# Patient Record
Sex: Male | Born: 1985 | Race: Black or African American | Hispanic: No | Marital: Single | Smoking: Current every day smoker
Health system: Southern US, Community
[De-identification: ages and names within clinical notes are randomized; demographics above are authoritative.]

## PROBLEM LIST (undated history)

## (undated) ENCOUNTER — Emergency Department: Admission: EM | Payer: Self-pay | Source: Home / Self Care

## (undated) DIAGNOSIS — I10 Essential (primary) hypertension: Secondary | ICD-10-CM

---

## 2004-05-01 ENCOUNTER — Emergency Department: Payer: Self-pay | Admitting: Emergency Medicine

## 2006-01-17 ENCOUNTER — Emergency Department: Payer: Self-pay | Admitting: General Practice

## 2017-09-23 ENCOUNTER — Other Ambulatory Visit: Payer: Self-pay

## 2017-09-23 ENCOUNTER — Encounter: Payer: Self-pay | Admitting: Emergency Medicine

## 2017-09-23 ENCOUNTER — Emergency Department
Admission: EM | Admit: 2017-09-23 | Discharge: 2017-09-23 | Disposition: A | Discharge status: Home / Self Care | Payer: Self-pay | Attending: Emergency Medicine | Admitting: Emergency Medicine

## 2017-09-23 DIAGNOSIS — F17210 Nicotine dependence, cigarettes, uncomplicated: Secondary | ICD-10-CM | POA: Insufficient documentation

## 2017-09-23 DIAGNOSIS — J020 Streptococcal pharyngitis: Secondary | ICD-10-CM | POA: Insufficient documentation

## 2017-09-23 DIAGNOSIS — I10 Essential (primary) hypertension: Secondary | ICD-10-CM | POA: Insufficient documentation

## 2017-09-23 HISTORY — DX: Essential (primary) hypertension: I10

## 2017-09-23 LAB — GROUP A STREP BY PCR: GROUP A STREP BY PCR: DETECTED — AB

## 2017-09-23 MED ORDER — PREDNISONE 20 MG PO TABS
60.0000 mg | ORAL_TABLET | Freq: Once | ORAL | Status: AC
  Administered 2017-09-23: 60 mg via ORAL
  Filled 2017-09-23: qty 3

## 2017-09-23 MED ORDER — AMOXICILLIN-POT CLAVULANATE 875-125 MG PO TABS: 1.0000 | ORAL_TABLET | Freq: Two times a day (BID) | ORAL | 0 refills | Status: AC

## 2017-09-23 MED ORDER — AMOXICILLIN-POT CLAVULANATE 875-125 MG PO TABS
1.0000 | ORAL_TABLET | Freq: Once | ORAL | Status: AC
  Administered 2017-09-23: 1 via ORAL
  Filled 2017-09-23: qty 1

## 2017-09-23 MED ORDER — PREDNISONE 10 MG PO TABS: 10.0000 mg | ORAL_TABLET | Freq: Every day | ORAL | 0 refills | Status: AC

## 2017-09-23 NOTE — ED Provider Notes (Signed)
[facility] EMERGENCY DEPARTMENT Provider Note   CSN: [id number] Arrival date & time: 09/23/17  2043     History   Chief Complaint Chief Complaint  Patient presents with  . Sore Throat    HPI Jon Benjamin is a 32 y.o. male.  Presents to the emergency department evaluation of sore throat x7 days.  He denies any fevers but has had chills.  No cough congestion or runny nose.  No rashes or abdominal pain.  No chest pain or shortness of breath.  His girlfriend has had similar symptoms, his symptoms began first.  He is tolerating p.o. well.  Pain is 8 out of 10 with swallowing.  HPI  Past Medical History:  Diagnosis Date  . Hypertension     There are no active problems to display for this patient.   History reviewed. No pertinent surgical history.      Home Medications    Prior to Admission medications   Medication Sig Start Date End Date Taking? Authorizing Provider  amoxicillin-clavulanate (AUGMENTIN) 875-125 MG tablet Take 1 tablet by mouth every 12 (twelve) hours for 10 days. 09/23/17 10/03/17  Evon SlackGaines, Thomas C, PA-C  predniSONE (DELTASONE) 10 MG tablet Take 1 tablet (10 mg total) by mouth daily. 6,5,4,3,2,1 six day taper 09/23/17   Evon SlackGaines, Thomas C, PA-C    Family History No family history on file.  Social History Social History   Tobacco Use  . Smoking status: Current Every Day Smoker  . Smokeless tobacco: Never Used  Substance Use Topics  . Alcohol use: Not on file  . Drug use: Not on file     Allergies   Patient has no known allergies.   Review of Systems Review of Systems  Constitutional: Positive for chills. Negative for fever.  HENT: Positive for sore throat. Negative for congestion, rhinorrhea, sinus pressure and sinus pain.   Respiratory: Negative for cough, wheezing and stridor.   Gastrointestinal: Negative for diarrhea, nausea and vomiting.  Musculoskeletal: Positive for myalgias. Negative for arthralgias and neck  stiffness.  Skin: Negative for rash and wound.  Neurological: Negative for dizziness.     Physical Exam Updated Vital Signs BP (!) 176/80 (BP Location: Left Arm)   Pulse 86   Temp 99 F (37.2 C) (Oral)   Resp 20   Ht 6\' 1"  (1.854 m)   Wt 99.8 kg   SpO2 100%   BMI 29.03 kg/m   Physical Exam  Constitutional: He is oriented to person, place, and time. He appears well-developed and well-nourished. No distress.  HENT:  Head: Normocephalic and atraumatic.  Right Ear: Hearing, tympanic membrane, external ear and ear canal normal.  Left Ear: Hearing, tympanic membrane, external ear and ear canal normal.  Nose: Rhinorrhea present. No nasal septal hematoma. Right sinus exhibits no maxillary sinus tenderness and no frontal sinus tenderness. Left sinus exhibits no maxillary sinus tenderness and no frontal sinus tenderness.  Mouth/Throat: No trismus in the jaw. No uvula swelling. Oropharyngeal exudate present. No posterior oropharyngeal edema, posterior oropharyngeal erythema or tonsillar abscesses.  Uvula is midline.  Tolerating p.o. well.  Eyes: Conjunctivae are normal.  Neck: Normal range of motion.  Cardiovascular: Normal rate and regular rhythm.  Pulmonary/Chest: No stridor. No respiratory distress. He has no wheezes. He exhibits no tenderness.  Abdominal: Soft. He exhibits no distension. There is no tenderness. There is no guarding.  Musculoskeletal: Normal range of motion.  Lymphadenopathy:    He has no cervical adenopathy.  Neurological: He is  alert and oriented to person, place, and time.  Skin: Skin is warm and dry. No rash noted.  Psychiatric: He has a normal mood and affect. His behavior is normal. Judgment and thought content normal.     ED Treatments / Results  Labs (all labs ordered are listed, but only abnormal results are displayed) Labs Reviewed  GROUP A STREP BY PCR - Abnormal; Notable for the following components:      Result Value   Group A Strep by PCR  DETECTED (*)    All other components within normal limits    EKG None  Radiology No results found.  Procedures Procedures (including critical care time)  Medications Ordered in ED Medications  amoxicillin-clavulanate (AUGMENTIN) 875-125 MG per tablet 1 tablet (1 tablet Oral Given 09/23/17 2213)  predniSONE (DELTASONE) tablet 60 mg (60 mg Oral Given 09/23/17 2213)     Initial Impression / Assessment and Plan / ED Course  I have reviewed the triage vital signs and the nursing notes.  Pertinent labs & imaging results that were available during my care of the patient were reviewed by me and considered in my medical decision making (see chart for details).     32 year old male with strep pharyngitis.  PCR positive for strep.  Vital signs are stable, low-grade fever of 99.  Patient tolerating p.o. well.  He is given a prescription for Augmentin and prednisone.  Final Clinical Impressions(s) / ED Diagnoses   Final diagnoses:  Strep pharyngitis    ED Discharge Orders         Ordered    predniSONE (DELTASONE) 10 MG tablet  Daily     09/23/17 2215    amoxicillin-clavulanate (AUGMENTIN) 875-125 MG tablet  Every 12 hours     09/23/17 2215           Ronnette Juniper 09/23/17 2218    Jeanmarie Plant, MD 09/24/17 [phone]

## 2017-09-23 NOTE — ED Triage Notes (Signed)
Patient ambulatory to triage with steady gait, without difficulty or distress noted; pt reports sore throat since last wk; here with SO with same symptoms; denies any accomp symptoms

## 2017-09-23 NOTE — Discharge Instructions (Signed)
Please make sure drinking lots of fluids.  Take antibiotics as prescribed.

## 2019-05-12 ENCOUNTER — Encounter: Payer: Self-pay | Admitting: Emergency Medicine

## 2019-05-12 ENCOUNTER — Other Ambulatory Visit: Payer: Self-pay

## 2019-05-12 ENCOUNTER — Emergency Department
Admission: EM | Admit: 2019-05-12 | Discharge: 2019-05-12 | Disposition: A | Discharge status: Home / Self Care | Payer: Self-pay | Attending: Emergency Medicine | Admitting: Emergency Medicine

## 2019-05-12 ENCOUNTER — Emergency Department: Payer: Self-pay

## 2019-05-12 DIAGNOSIS — F17200 Nicotine dependence, unspecified, uncomplicated: Secondary | ICD-10-CM | POA: Insufficient documentation

## 2019-05-12 DIAGNOSIS — I10 Essential (primary) hypertension: Secondary | ICD-10-CM | POA: Insufficient documentation

## 2019-05-12 LAB — BASIC METABOLIC PANEL
Anion gap: 6 (ref 5–15)
BUN: 13 mg/dL (ref 6–20)
CO2: 30 mmol/L (ref 22–32)
Calcium: 9.3 mg/dL (ref 8.9–10.3)
Chloride: 103 mmol/L (ref 98–111)
Creatinine, Ser: 1.2 mg/dL (ref 0.61–1.24)
GFR calc Af Amer: >60 mL/min (ref >60)
GFR calc non Af Amer: >60 mL/min (ref >60)
Glucose, Bld: 101 mg/dL — ABNORMAL HIGH (ref 70–99)
Potassium: 3.7 mmol/L (ref 3.5–5.1)
Sodium: 139 mmol/L (ref 135–145)

## 2019-05-12 LAB — CBC
HCT: 46.9 % (ref 39.0–52.0)
Hemoglobin: 15.3 g/dL (ref 13.0–17.0)
MCH: 28.3 pg (ref 26.0–34.0)
MCHC: 32.6 g/dL (ref 30.0–36.0)
MCV: 86.9 fL (ref 80.0–100.0)
Platelets: 285 10*3/uL (ref 150–400)
RBC: 5.4 MIL/uL (ref 4.22–5.81)
RDW: 13.3 % (ref 11.5–15.5)
WBC: 5 10*3/uL (ref 4.0–10.5)
nRBC: 0 % (ref 0.0–0.2)

## 2019-05-12 LAB — TROPONIN I (HIGH SENSITIVITY): Troponin I (High Sensitivity): 9 ng/L (ref <18)

## 2019-05-12 MED ORDER — BENAZEPRIL HCL 10 MG PO TABS
10.0000 mg | ORAL_TABLET | Freq: Every day | ORAL | Status: DC
  Administered 2019-05-12: 10 mg via ORAL
  Filled 2019-05-12 (×2): qty 1

## 2019-05-12 MED ORDER — AMLODIPINE BESYLATE 5 MG PO TABS
5.0000 mg | ORAL_TABLET | Freq: Once | ORAL | Status: AC
  Administered 2019-05-12: 15:00:00 5 mg via ORAL
  Filled 2019-05-12: qty 1

## 2019-05-12 MED ORDER — BENAZEPRIL HCL 10 MG PO TABS: 10.0000 mg | ORAL_TABLET | Freq: Every day | ORAL | 1 refills | Status: AC

## 2019-05-12 MED ORDER — AMLODIPINE BESYLATE 5 MG PO TABS: 5.0000 mg | ORAL_TABLET | Freq: Every day | ORAL | 1 refills | Status: AC

## 2019-05-12 MED ORDER — SODIUM CHLORIDE 0.9% FLUSH: 3.0000 mL | Freq: Once | INTRAVENOUS | Status: DC

## 2019-05-12 NOTE — ED Provider Notes (Signed)
[facility] Emergency Department Provider Note   ____________________________________________    I have reviewed the triage vital signs and the nursing notes.   HISTORY  Chief Complaint Hypertension     HPI Jon Benjamin is a 34 y.o. male who reports a history of high blood pressure and has been prescribed blood pressure medication in the past but has not taken it.  Yesterday he checked his blood pressure at his father's house and found it to be high.  He does not have a PCP.  He feels well he has no symptoms.  Decided to come today because his family pressure to him.  Reports he works out daily.  Past Medical History:  Diagnosis Date  . Hypertension     There are no problems to display for this patient.   History reviewed. No pertinent surgical history.  Prior to Admission medications   Medication Sig Start Date End Date Taking? Authorizing Provider  amLODipine (NORVASC) 5 MG tablet Take 1 tablet (5 mg total) by mouth daily. 05/12/19 05/11/20  Lavonia Drafts, MD  benazepril (LOTENSIN) 10 MG tablet Take 1 tablet (10 mg total) by mouth daily. 05/12/19 07/11/19  Lavonia Drafts, MD  predniSONE (DELTASONE) 10 MG tablet Take 1 tablet (10 mg total) by mouth daily. 6,5,4,3,2,1 six day taper 09/23/17   Duanne Guess, PA-C     Allergies Patient has no known allergies.  No family history on file.  Social History Social History   Tobacco Use  . Smoking status: Current Every Day Smoker  . Smokeless tobacco: Never Used  Substance Use Topics  . Alcohol use: Not on file  . Drug use: Not on file    Review of Systems  Constitutional: No fever/chills Eyes: No visual changes.  ENT: No sore throat. Cardiovascular: Denies chest pain. Respiratory: Denies shortness of breath. Gastrointestinal: No abdominal pain.   Genitourinary: Negative for dysuria. Musculoskeletal: Negative for back pain. Skin: Negative for rash. Neurological: Negative for  headaches or weakness   ____________________________________________   PHYSICAL EXAM:  VITAL SIGNS: ED Triage Vitals  Enc Vitals Group     BP 05/12/19 1142 (!) 216/138     Pulse Rate 05/12/19 1142 65     Resp 05/12/19 1142 20     Temp 05/12/19 1142 98.6 F (37 C)     Temp Source 05/12/19 1142 Oral     SpO2 05/12/19 1142 100 %     Weight 05/12/19 1144 99.8 kg (220 lb)     Height 05/12/19 1144 1.854 m (6\' 1" )     Head Circumference --      Peak Flow --      Pain Score 05/12/19 1147 0     Pain Loc --      Pain Edu? --      Excl. in [place]? --     Constitutional: Alert and oriented. No acute distress. Pleasant and interactive Eyes: Conjunctivae are normal.   Mouth/Throat: Mucous membranes are moist.   Neck:  Painless ROM Cardiovascular: Normal rate, regular rhythm. Grossly normal heart sounds.  Good peripheral circulation. Respiratory: Normal respiratory effort.  No retractions. Lungs CTAB. Gastrointestinal: Soft and nontender. No distention.  No CVA tenderness.  Musculoskeletal:   Warm and well perfused Neurologic:  Normal speech and language. No gross focal neurologic deficits are appreciated.  Skin:  Skin is warm, dry and intact. No rash noted. Psychiatric: Mood and affect are normal. Speech and behavior are normal.  ____________________________________________   LABS (  all labs ordered are listed, but only abnormal results are displayed)  Labs Reviewed  BASIC METABOLIC PANEL - Abnormal; Notable for the following components:      Result Value   Glucose, Bld 101 (*)    All other components within normal limits  CBC  TROPONIN I (HIGH SENSITIVITY)   ____________________________________________  EKG  ED ECG REPORT I, Jene Every, the attending physician, personally viewed and interpreted this ECG.  Date: 05/12/2019  Rhythm: normal sinus rhythm QRS Axis: normal Intervals: normal ST/T Wave abnormalities: normal Narrative Interpretation:  LVH  ____________________________________________  RADIOLOGY  None ____________________________________________   PROCEDURES  Procedure(s) performed: No  Procedures   Critical Care performed: No ____________________________________________   INITIAL IMPRESSION / ASSESSMENT AND PLAN / ED COURSE  Pertinent labs & imaging results that were available during my care of the patient were reviewed by me and considered in my medical decision making (see chart for details).  Patient well-appearing and in no acute distress, does have significant high blood pressure however this is apparently chronic.  He is totally asymptomatic.  Lab work including kidney function is reassuring.  We will start the patient on amlodipine and benazepril, first dose given here in the emergency department.  Emphasized need for close follow-up with PCP for titration.    ____________________________________________   FINAL CLINICAL IMPRESSION(S) / ED DIAGNOSES  Final diagnoses:  Essential hypertension        Note:  This document was prepared using Dragon voice recognition software and may include unintentional dictation errors.   Jene Every, MD 05/12/19 1744

## 2019-05-12 NOTE — ED Notes (Signed)
Pt verbalized understanding of discharge instructions. NAD at this time. 

## 2019-05-12 NOTE — ED Triage Notes (Signed)
Arrives with c/o hypertension.  States was prescribed medication for BP over a year ago, but has not taken medication since Rx ran out.

## 2020-06-23 ENCOUNTER — Emergency Department
Admission: EM | Admit: 2020-06-23 | Discharge: 2020-06-23 | Disposition: A | Discharge status: Home / Self Care | Payer: Self-pay | Attending: Emergency Medicine | Admitting: Emergency Medicine

## 2020-06-23 ENCOUNTER — Other Ambulatory Visit: Payer: Self-pay

## 2020-06-23 ENCOUNTER — Emergency Department: Payer: Self-pay

## 2020-06-23 DIAGNOSIS — N5089 Other specified disorders of the male genital organs: Secondary | ICD-10-CM

## 2020-06-23 DIAGNOSIS — F17200 Nicotine dependence, unspecified, uncomplicated: Secondary | ICD-10-CM | POA: Insufficient documentation

## 2020-06-23 DIAGNOSIS — N451 Epididymitis: Secondary | ICD-10-CM | POA: Insufficient documentation

## 2020-06-23 DIAGNOSIS — I10 Essential (primary) hypertension: Secondary | ICD-10-CM | POA: Insufficient documentation

## 2020-06-23 DIAGNOSIS — Z79899 Other long term (current) drug therapy: Secondary | ICD-10-CM | POA: Insufficient documentation

## 2020-06-23 LAB — URINALYSIS, ROUTINE W REFLEX MICROSCOPIC
Bilirubin Urine: NEGATIVE
Glucose, UA: NEGATIVE mg/dL
Hgb urine dipstick: NEGATIVE
Ketones, ur: NEGATIVE mg/dL
Nitrite: NEGATIVE
Protein, ur: NEGATIVE mg/dL
Specific Gravity, Urine: 1.012 (ref 1.005–1.030)
Squamous Epithelial / HPF: NONE SEEN (ref 0–5)
WBC, UA: >50 WBC/hpf — ABNORMAL HIGH (ref 0–5)
pH: 7 (ref 5.0–8.0)

## 2020-06-23 MED ORDER — DOXYCYCLINE HYCLATE 100 MG PO CAPS: 100.0000 mg | ORAL_CAPSULE | Freq: Two times a day (BID) | ORAL | 0 refills | Status: AC

## 2020-06-23 MED ORDER — CEFTRIAXONE SODIUM 1 G IJ SOLR
500.0000 mg | Freq: Once | INTRAMUSCULAR | Status: AC
  Administered 2020-06-23: 500 mg via INTRAMUSCULAR
  Filled 2020-06-23: qty 10

## 2020-06-23 MED ORDER — DOXYCYCLINE HYCLATE 100 MG PO TABS
100.0000 mg | ORAL_TABLET | Freq: Once | ORAL | Status: AC
  Administered 2020-06-23: 100 mg via ORAL
  Filled 2020-06-23: qty 1

## 2020-06-23 NOTE — ED Notes (Signed)
Patient returned from Ultrasound. 

## 2020-06-23 NOTE — ED Triage Notes (Signed)
Pt to ER via POV with complaints of left sided testicle swelling. States over the last four days it has intermittently swelled.   Pt reports no urinary symptoms. No drainage, redness, or pain to testes. No known fevers.   Pt reports swelling occurs after exercising and weight lifting. Reports using the jacuzzi and sauna.

## 2020-06-23 NOTE — ED Provider Notes (Signed)
[facility] Emergency Department Provider Note   ____________________________________________   Event Date/Time   First MD Initiated Contact with Patient 06/23/20 1445     (approximate)  I have reviewed the triage vital signs and the nursing notes.   HISTORY  Chief Complaint Groin Swelling    HPI CHANC Jon Benjamin is a 35 y.o. male for evaluation of discomfort in his left testicle for about a week   Patient reports that he noticed when he got up off the couch about 4 to 5 days ago that his left testicle seemed a little bit sore and slightly swollen.  He reports he has not had any discharge no pain or burning with urination.  Not severely painful but does feel little bit swollen and a little bit tender especially towards the back  He is sexually active but with the same partner 1 male for a long time.  He does not engage in multiple partners.  In addition, he does report he has been in and out of a hot tub recently [place] and sauna at the gym.  Thinking maybe he had picked up a potential infection there  No fevers or chills no abdominal pain.  No chest pain no trouble breathing  No previous history of any STDs or urinary tract infection  Past Medical History:  Diagnosis Date  . Hypertension     There are no problems to display for this patient.   History reviewed. No pertinent surgical history.  Prior to Admission medications   Medication Sig Start Date End Date Taking? Authorizing Provider         amLODipine (NORVASC) 5 MG tablet Take 1 tablet (5 mg total) by mouth daily. 05/12/19 05/11/20  Jene Every, MD  benazepril (LOTENSIN) 10 MG tablet Take 1 tablet (10 mg total) by mouth daily. 05/12/19 07/11/19  Jene Every, MD  predniSONE (DELTASONE) 10 MG tablet Take 1 tablet (10 mg total) by mouth daily. 6,5,4,3,2,1 six day taper 09/23/17   Evon Slack, PA-C    Allergies Patient has no known allergies.  No family history on  file.  Social History Social History   Tobacco Use  . Smoking status: Current Every Day Smoker  . Smokeless tobacco: Never Used    Review of Systems Constitutional: No fever/chills  ENT: No sore throat. Cardiovascular: Denies chest pain. Respiratory: Denies shortness of breath. Gastrointestinal: No abdominal pain.   Genitourinary: Negative for dysuria.  See HPI.  Pain and some swelling along the back and left testicle.  No problems on the right side.  No pain in the left groin Musculoskeletal: Negative for back pain. Skin: Negative for rash.  ____________________________________________   PHYSICAL EXAM:  VITAL SIGNS: ED Triage Vitals  Enc Vitals Group     BP 06/23/20 1233 (!) 103/92     Pulse Rate 06/23/20 1233 69     Resp 06/23/20 1233 17     Temp 06/23/20 1233 98.9 F (37.2 C)     Temp Source 06/23/20 1233 Oral     SpO2 06/23/20 1233 100 %     Weight 06/23/20 1243 230 lb (104.3 kg)     Height 06/23/20 1243 6\' 1"  (1.854 m)     Head Circumference --      Peak Flow --      Pain Score 06/23/20 1243 0     Pain Loc --      Pain Edu? --      Excl. in [place]? --  Constitutional: Alert and oriented. Well appearing and in no acute distress. Head: Atraumatic. Nose: No congestion/rhinnorhea. Mouth/Throat: Mucous membranes are moist. Neck: No stridor.  Cardiovascular: Warm well perfused Respiratory: Normal respiratory effort.  No retractions.  Speaks in full clear sentences Gastrointestinal: Soft and nontender. No distention. Genitourinary: Detumescence of the penis.  Right testicle nontender scrotum normal.  Left testicle slightly tender to palpation primarily around the posterior region and slight swelling of the epididymis.  No hernias bilateral.  Scrotum itself is normal bilateral.  No erythema.   Neurologic:  Normal speech and language. No gross focal neurologic deficits are appreciated.  Skin:  Skin is warm, dry and intact. No rash noted. Psychiatric: Mood and affect  are normal. Speech and behavior are normal.  ____________________________________________   LABS (all labs ordered are listed, but only abnormal results are displayed)  Labs Reviewed  URINALYSIS, ROUTINE W REFLEX MICROSCOPIC - Abnormal; Notable for the following components:      Result Value   Color, Urine YELLOW (*)    APPearance HAZY (*)    Leukocytes,Ua MODERATE (*)    WBC, UA >50 (*)    Bacteria, UA RARE (*)    All other components within normal limits  URINE CULTURE   ____________________________________________  EKG   ____________________________________________  RADIOLOGY  US SCROTUM W/DOPPLER  Result Date: 06/23/2020 CLINICAL DATA:  Left testicular pain and swelling, initial encounter EXAM: SCROTAL ULTRASOUND DOPPLER ULTRASOUND OF THE TESTICLES TECHNIQUE: Complete ultrasound examination of the testicles, epididymis, and other scrotal structures was performed. Color and spectral Doppler ultrasound were also utilized to evaluate blood flow to the testicles. COMPARISON:  None. FINDINGS: Right testicle Measurements: 4.9 x 2.7 x 3.3 cm. No mass or microlithiasis visualized. Left testicle Measurements: 4.7 x 2.8 x 3.1 cm. No mass or microlithiasis visualized. Right epididymis:  Normal in size and appearance. Left epididymis: Left epididymis is enlarged and mildly irregular with increased vascularity consistent with epididymitis. Hydrocele:  Left-sided hydrocele is noted. Varicocele:  Mild left varicocele is seen as well. Pulsed Doppler interrogation of both testes demonstrates normal low resistance arterial and venous waveforms bilaterally. IMPRESSION: Normal-appearing testicles. Findings consistent with left epididymitis. Left hydrocele and varicocele are noted. Electronically Signed   By: Alcide Clever M.D.   On: 06/23/2020 15:14     Findings most consistent with left epididymitis. ____________________________________________   PROCEDURES  Procedure(s) performed:  None  Procedures  Critical Care performed: No  ____________________________________________   INITIAL IMPRESSION / ASSESSMENT AND PLAN / ED COURSE  Pertinent labs & imaging results that were available during my care of the patient were reviewed by me and considered in my medical decision making (see chart for details).   Clinical history and examination consistent with epididymitis.  No evidence of torsion no evidence of hernia.  He is nontoxic no evidence of sepsis  Urinalysis is concerning for possible infection.  He is sexually active with 1 male partner.  Does report in and out of hot tubs as well.  Will treat with Rocephin as well as doxycycline.  Discussed with the patient he is agreeable with this and careful return precautions.  Will follow up with urology on an outpatient basis as well.  Return precautions and treatment recommendations and follow-up discussed with the patient who is agreeable with the plan.  Urine sent for culture     ____________________________________________   FINAL CLINICAL IMPRESSION(S) / ED DIAGNOSES  Final diagnoses:  Epididymitis, left        Note:  This document  was prepared using Conservation officer, historic buildings and may include unintentional dictation errors       Sharyn Creamer, MD 06/23/20 [phone]

## 2020-06-25 LAB — URINE CULTURE: Culture: NO GROWTH

## 2022-02-08 IMAGING — US US SCROTUM W/ DOPPLER COMPLETE
1 series · 15 of 25 positions shown · non-contrast
Comparison: None.

CLINICAL DATA: Left testicular pain and swelling, initial encounter

EXAM:
SCROTAL ULTRASOUND
DOPPLER ULTRASOUND OF THE TESTICLES
TECHNIQUE: Complete ultrasound examination of the testicles, epididymis, and
other scrotal structures was performed. Color and spectral Doppler
ultrasound were also utilized to evaluate blood flow to the
testicles.

[Series 1: us art/ven flow abd pelv doppl · 15 of 71 slices shown]
[im 1/71]
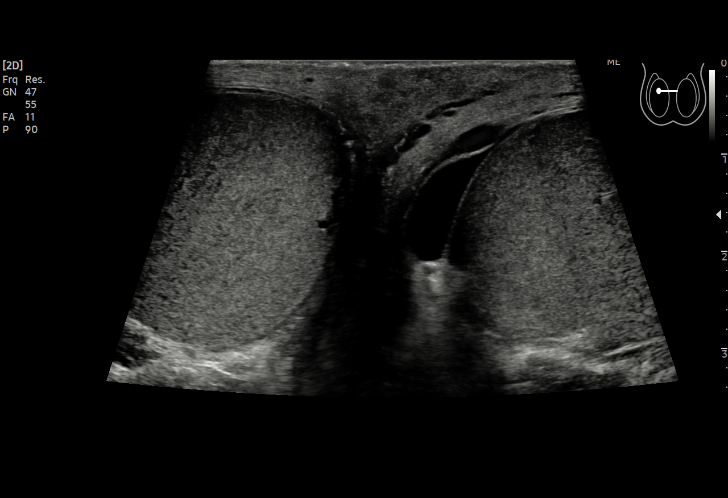
[im 6/71]
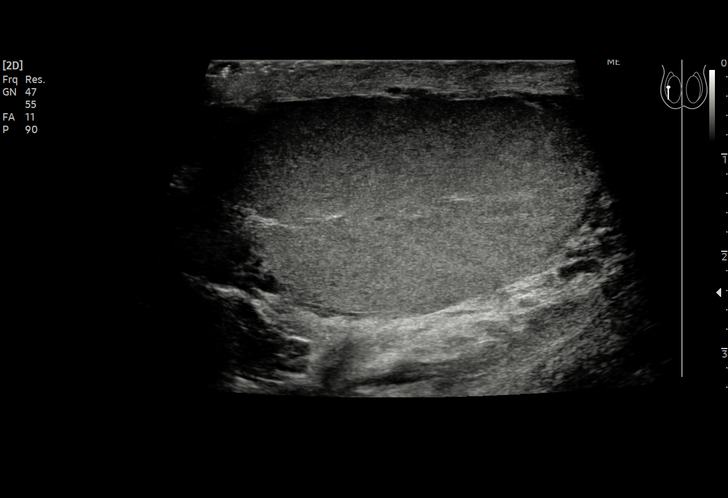
[im 12/71]
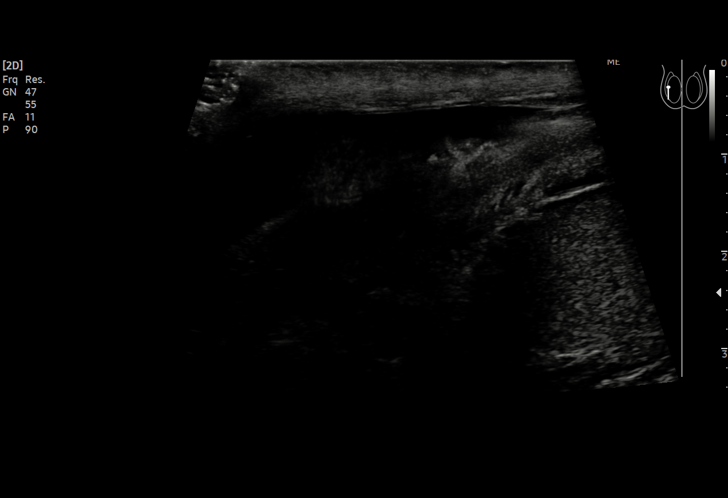
[im 15/71]
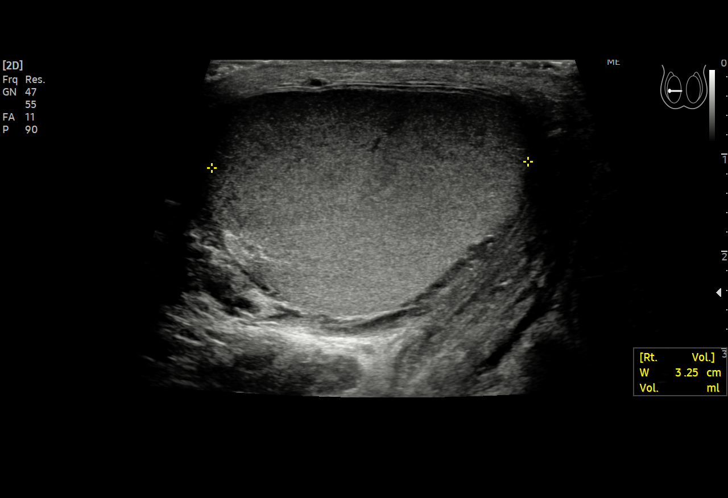
[im 21/71]
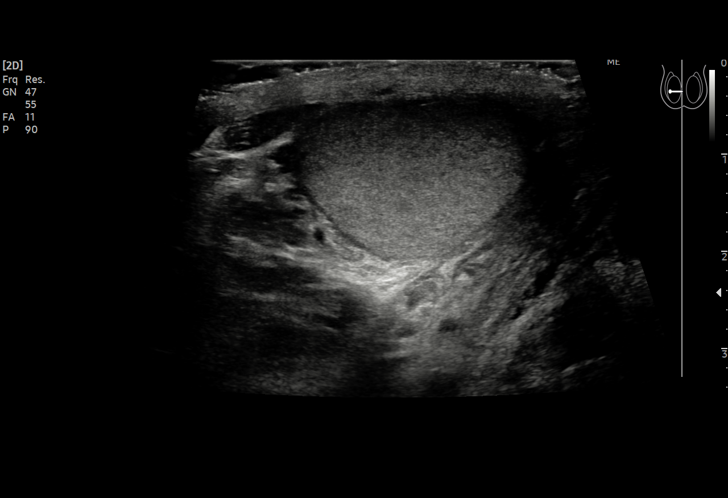
[im 27/71]
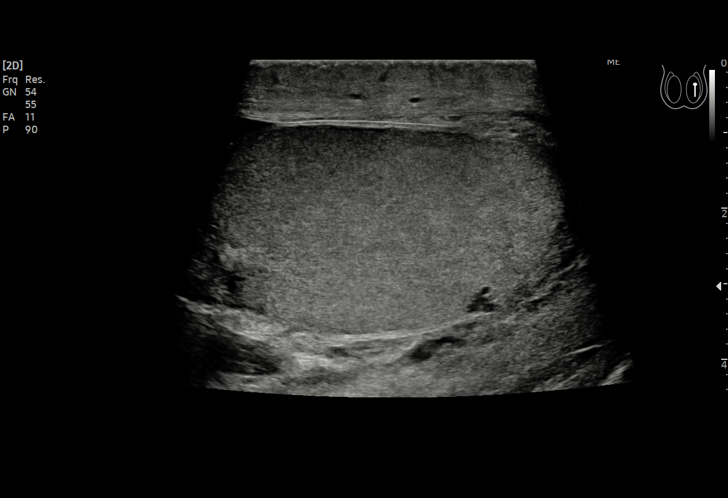
[im 30/71]
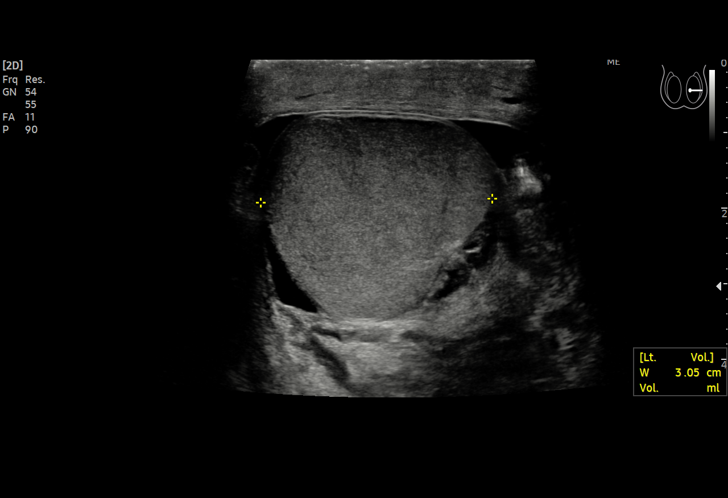
[im 36/71]
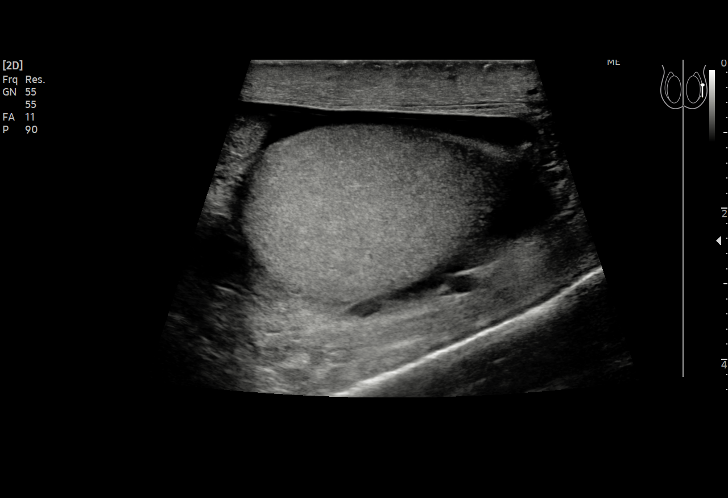
[im 41/71]
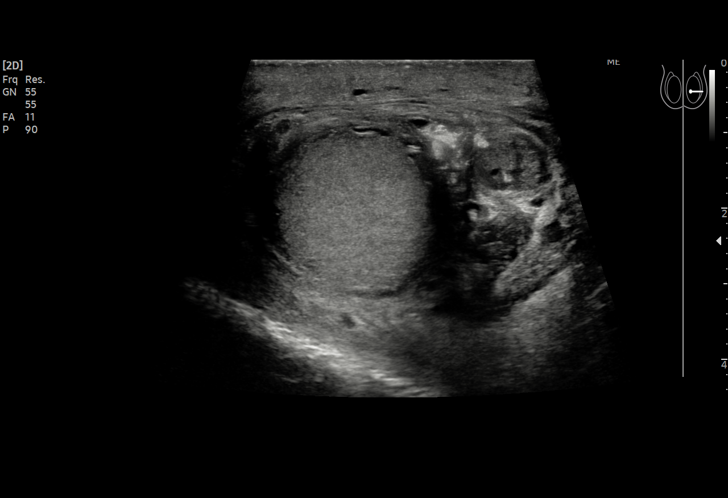
[im 44/71]
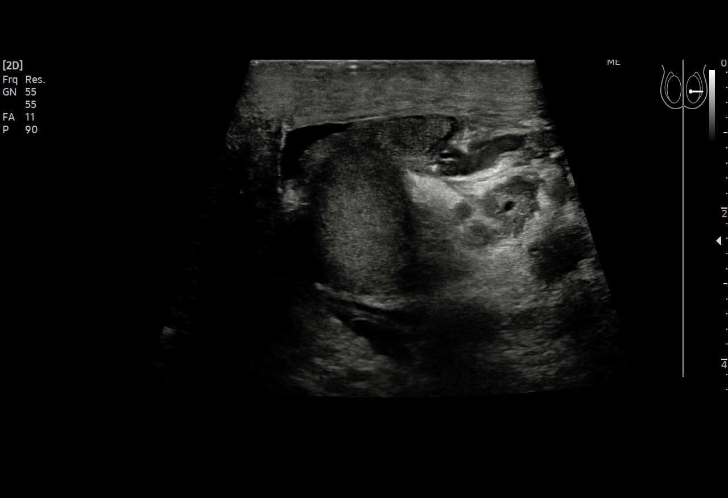
[im 50/71]
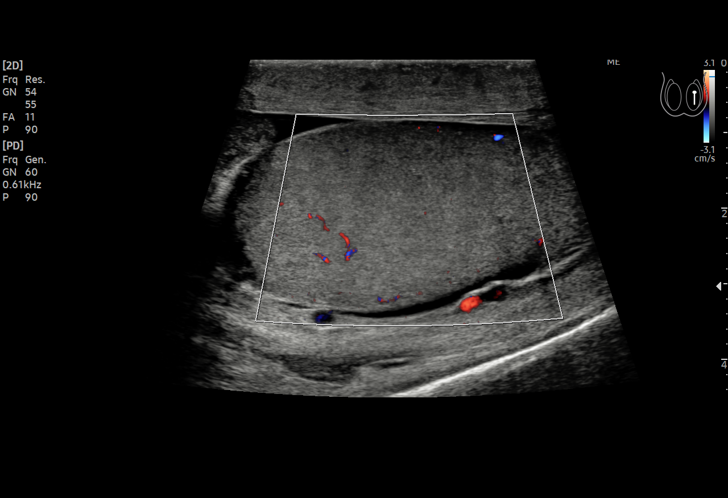
[im 56/71]
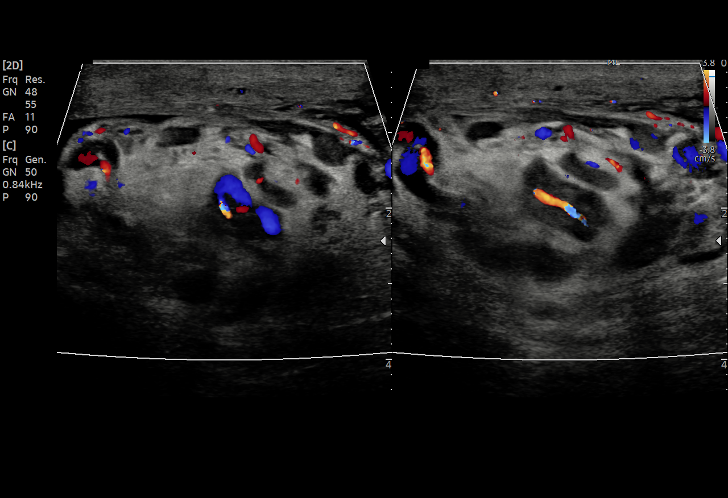
[im 59/71]
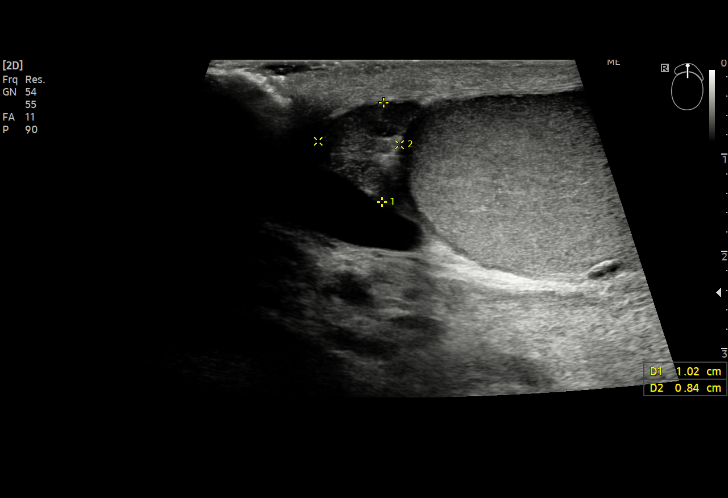
[im 65/71]
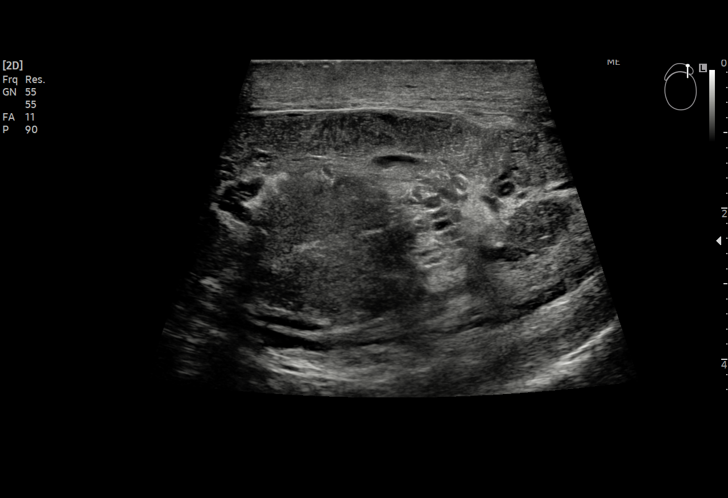
[im 71/71]
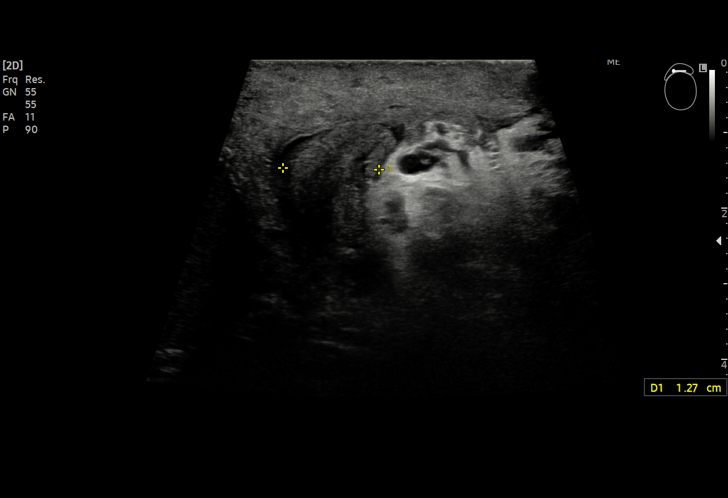

[15 of 25 positions shown; findings below may reference images not displayed]

FINDINGS: Right testicle

Measurements: 4.9 x 2.7 x 3.3 cm. No mass or microlithiasis
visualized.

Left testicle

Measurements: 4.7 x 2.8 x 3.1 cm. No mass or microlithiasis
visualized.

Right epididymis:  Normal in size and appearance.

Left epididymis: Left epididymis is enlarged and mildly irregular
with increased vascularity consistent with epididymitis.

Hydrocele:  Left-sided hydrocele is noted.

Varicocele:  Mild left varicocele is seen as well.

Pulsed Doppler interrogation of both testes demonstrates normal low
resistance arterial and venous waveforms bilaterally.
IMPRESSION: Normal-appearing testicles.

Findings consistent with left epididymitis.

Left hydrocele and varicocele are noted.
